# Patient Record
Sex: Female | Born: 1965 | Race: Black or African American | Hispanic: No | Marital: Married | Smoking: Never smoker
Health system: Southern US, Community
[De-identification: ages and names within clinical notes are randomized; demographics above are authoritative.]

## PROBLEM LIST (undated history)

## (undated) DIAGNOSIS — M545 Low back pain, unspecified: Secondary | ICD-10-CM

## (undated) DIAGNOSIS — M549 Dorsalgia, unspecified: Secondary | ICD-10-CM

## (undated) DIAGNOSIS — M25512 Pain in left shoulder: Secondary | ICD-10-CM

## (undated) DIAGNOSIS — C569 Malignant neoplasm of unspecified ovary: Secondary | ICD-10-CM

## (undated) DIAGNOSIS — C73 Malignant neoplasm of thyroid gland: Secondary | ICD-10-CM

## (undated) DIAGNOSIS — I10 Essential (primary) hypertension: Secondary | ICD-10-CM

## (undated) DIAGNOSIS — M25511 Pain in right shoulder: Secondary | ICD-10-CM

## (undated) DIAGNOSIS — E7800 Pure hypercholesterolemia, unspecified: Secondary | ICD-10-CM

## (undated) DIAGNOSIS — G8929 Other chronic pain: Secondary | ICD-10-CM

## (undated) HISTORY — DX: Other chronic pain: G89.29

## (undated) HISTORY — PX: GASTRIC BYPASS: SHX52

## (undated) HISTORY — DX: Low back pain, unspecified: M54.50

## (undated) HISTORY — PX: FEMUR SURGERY: SHX943

## (undated) HISTORY — PX: BILATERAL CARPAL TUNNEL RELEASE: SHX6508

## (undated) HISTORY — DX: Pain in left shoulder: M25.512

## (undated) HISTORY — DX: Pure hypercholesterolemia, unspecified: E78.00

## (undated) HISTORY — PX: CHOLECYSTECTOMY: SHX55

## (undated) HISTORY — DX: Pain in right shoulder: M25.511

## (undated) HISTORY — DX: Malignant neoplasm of unspecified ovary: C56.9

## (undated) HISTORY — PX: ABDOMINAL HYSTERECTOMY: SHX81

## (undated) HISTORY — PX: REPLACEMENT TOTAL KNEE: SUR1224

## (undated) HISTORY — DX: Low back pain: M54.5

## (undated) HISTORY — DX: Dorsalgia, unspecified: M54.9

## (undated) HISTORY — DX: Malignant neoplasm of thyroid gland: C73

---

## 2007-12-20 ENCOUNTER — Ambulatory Visit (HOSPITAL_BASED_OUTPATIENT_CLINIC_OR_DEPARTMENT_OTHER): Admission: RE | Admit: 2007-12-20 | Discharge: 2007-12-20 | Payer: Self-pay | Admitting: Internal Medicine

## 2009-12-21 ENCOUNTER — Ambulatory Visit (HOSPITAL_BASED_OUTPATIENT_CLINIC_OR_DEPARTMENT_OTHER): Admission: RE | Admit: 2009-12-21 | Discharge: 2009-12-21 | Payer: Self-pay | Admitting: Internal Medicine

## 2009-12-21 ENCOUNTER — Ambulatory Visit: Payer: Self-pay | Admitting: Diagnostic Radiology

## 2009-12-22 ENCOUNTER — Ambulatory Visit (HOSPITAL_BASED_OUTPATIENT_CLINIC_OR_DEPARTMENT_OTHER): Admission: RE | Admit: 2009-12-22 | Discharge: 2009-12-22 | Payer: Self-pay | Admitting: Internal Medicine

## 2009-12-22 ENCOUNTER — Ambulatory Visit: Payer: Self-pay | Admitting: Diagnostic Radiology

## 2010-02-18 ENCOUNTER — Ambulatory Visit
Admission: RE | Admit: 2010-02-18 | Discharge: 2010-02-18 | Payer: Self-pay | Source: Home / Self Care | Attending: Gynecologic Oncology | Admitting: Gynecologic Oncology

## 2011-01-12 ENCOUNTER — Emergency Department (HOSPITAL_COMMUNITY)
Admission: EM | Admit: 2011-01-12 | Discharge: 2011-01-12 | Disposition: A | Discharge status: Home / Self Care | Payer: No Typology Code available for payment source | Attending: Emergency Medicine | Admitting: Emergency Medicine

## 2011-01-12 ENCOUNTER — Encounter: Payer: Self-pay | Admitting: Emergency Medicine

## 2011-01-12 ENCOUNTER — Emergency Department (HOSPITAL_COMMUNITY): Payer: No Typology Code available for payment source

## 2011-01-12 DIAGNOSIS — M545 Low back pain, unspecified: Secondary | ICD-10-CM | POA: Insufficient documentation

## 2011-01-12 DIAGNOSIS — I10 Essential (primary) hypertension: Secondary | ICD-10-CM | POA: Insufficient documentation

## 2011-01-12 DIAGNOSIS — Z9884 Bariatric surgery status: Secondary | ICD-10-CM | POA: Insufficient documentation

## 2011-01-12 DIAGNOSIS — M25569 Pain in unspecified knee: Secondary | ICD-10-CM | POA: Insufficient documentation

## 2011-01-12 DIAGNOSIS — E119 Type 2 diabetes mellitus without complications: Secondary | ICD-10-CM | POA: Insufficient documentation

## 2011-01-12 DIAGNOSIS — M25559 Pain in unspecified hip: Secondary | ICD-10-CM | POA: Insufficient documentation

## 2011-01-12 DIAGNOSIS — Z90710 Acquired absence of both cervix and uterus: Secondary | ICD-10-CM | POA: Insufficient documentation

## 2011-01-12 DIAGNOSIS — R071 Chest pain on breathing: Secondary | ICD-10-CM | POA: Insufficient documentation

## 2011-01-12 DIAGNOSIS — M542 Cervicalgia: Secondary | ICD-10-CM | POA: Insufficient documentation

## 2011-01-12 DIAGNOSIS — R51 Headache: Secondary | ICD-10-CM | POA: Insufficient documentation

## 2011-01-12 HISTORY — DX: Essential (primary) hypertension: I10

## 2011-01-12 MED ORDER — KETOROLAC TROMETHAMINE 60 MG/2ML IM SOLN
60.0000 mg | Freq: Once | INTRAMUSCULAR | Status: AC
  Administered 2011-01-12: 60 mg via INTRAMUSCULAR
  Filled 2011-01-12: qty 2

## 2011-01-12 MED ORDER — TRAMADOL HCL 50 MG PO TABS: 50.0000 mg | ORAL_TABLET | Freq: Four times a day (QID) | ORAL | Status: AC | PRN

## 2011-01-12 MED ORDER — OXYCODONE-ACETAMINOPHEN 5-325 MG PO TABS
1.0000 | ORAL_TABLET | Freq: Once | ORAL | Status: AC
  Administered 2011-01-12: 1 via ORAL
  Filled 2011-01-12: qty 1

## 2011-01-12 NOTE — Progress Notes (Signed)
Orthopedic Tech Progress Note Patient Details:  Jacqueline Love 10/15/1965 [id number]  Other Ortho Devices Type of Ortho Device: Crutches;Knee Immobilizer Ortho Device Location: (L) LE Ortho Device Interventions: Application   Jennye Moccasin 01/12/2011, 7:13 PM

## 2011-01-12 NOTE — ED Notes (Signed)
ER doc at bedside. ER doc removed c collar and backboard. Pt rolling around on bed. Pt c/o left knee pain. Pt difficult to assess d/t pt rolling around on bed and crying.
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
## 2011-01-12 NOTE — Discharge Instructions (Signed)
Your x-rays do not show any signs of fractures or dislocations.  Use ibuprofen 600 mg every 6 hours for the next 48 hours.  Use tramadol for more severe pain.  Followup with your Dr. if your symptoms.  Last more than 3-4 days.  Return for worse or uncontrolled symptoms.

## 2011-01-12 NOTE — ED Notes (Signed)
Knee immobilizer and crutches completed by ortho tech.
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
## 2017-07-05 ENCOUNTER — Encounter: Payer: Self-pay | Admitting: *Deleted

## 2017-07-06 ENCOUNTER — Encounter: Payer: Self-pay | Admitting: Neurology

## 2017-07-06 ENCOUNTER — Ambulatory Visit: Payer: Medicare Other | Admitting: Neurology

## 2017-07-06 ENCOUNTER — Telehealth: Payer: Self-pay | Admitting: Neurology

## 2017-07-06 NOTE — Telephone Encounter (Signed)
This patient did not show for a new patient appointment today. 

## 2017-08-14 ENCOUNTER — Ambulatory Visit (INDEPENDENT_AMBULATORY_CARE_PROVIDER_SITE_OTHER): Payer: Medicare Other | Admitting: Neurology

## 2017-08-14 ENCOUNTER — Encounter

## 2017-08-14 ENCOUNTER — Encounter: Payer: Self-pay | Admitting: Neurology

## 2017-08-14 VITALS — BP 157/102 | HR 80 | Ht 63.0 in | Wt 246.0 lb

## 2017-08-14 DIAGNOSIS — R413 Other amnesia: Secondary | ICD-10-CM

## 2017-08-14 DIAGNOSIS — E538 Deficiency of other specified B group vitamins: Secondary | ICD-10-CM

## 2017-08-14 DIAGNOSIS — R202 Paresthesia of skin: Secondary | ICD-10-CM | POA: Diagnosis not present

## 2017-08-14 NOTE — Progress Notes (Signed)
Reason for visit: Leg numbness, memory disturbance  Referring physician: Dr. Sanjuana Jacqueline Love is a 52 y.o. female  History of present illness:  [name] is a 52 year old right-handed black female with a history of obesity and chronic low back pain.  The patient is on disability for the back issues, she is on chronic opioid therapy for her back pain.  She indicates that over the last year or so she has had intermittent numbness that occurs down the legs, right greater than left.  She has had 2 severe episodes of numbness while sitting on the toilet, she was not able to control her legs.  The patient may have some numbness and tingling sensations in the hands and in the feet that she may notice at nighttime.  The patient had a gastric bypass procedure 10 years ago, she has not been taking vitamin B12 supplementation regularly.  The patient is on Topamax, but this was started just a month ago she claims.  The patient also has noted some problems with memory, difficulty remembering names for things or people over the last 6 months.  The patient feels that the problem is very mild but she is concerned about it.  The patient does not snore at night, she claims that her energy level during the day is good.  She denies any issues with balance, she has not had any falls.  She denies issues controlling the bowels or the bladder.  The patient has been seen by her primary care doctor, CT scan of the head was ordered, the patient claims that this has not yet been done.  She had MRI of the brain done in November 2017 when she had an event of Bell's palsy.  The study was relatively unremarkable.  Past Medical History:  Diagnosis Date  . Chronic pain   . Diabetes mellitus   . Hypercholesteremia   . Hypertension   . Low back pain   . Ovarian cancer ([place])   . Shoulder pain, bilateral   . Thyroid cancer ([place])   . Upper back pain     Past Surgical History:  Procedure Laterality Date  .  ABDOMINAL HYSTERECTOMY    . BILATERAL CARPAL TUNNEL RELEASE    . CHOLECYSTECTOMY    . FEMUR SURGERY    . GASTRIC BYPASS    . REPLACEMENT TOTAL KNEE      Family History  Problem Relation Age of Onset  . Hypertension Mother   . COPD Mother   . Hypertension Father   . Hypertension Brother   . Heart attack Brother     Social history:  reports that she has never smoked. She has never used smokeless tobacco. She reports that she drinks alcohol. She reports that she does not use drugs.  Medications:  Prior to Admission medications   Medication Sig Start Date End Date Taking? Authorizing Provider  amLODipine (NORVASC) 5 MG tablet Take 5 mg by mouth daily.     Yes [provider]  ATENOLOL PO Take 1 tablet by mouth daily.   Yes [provider]  azelastine (ASTELIN) 0.1 % nasal spray Place 2 sprays into both nostrils daily. Use in each nostril as directed   Yes [provider]  baclofen (LIORESAL) 10 MG tablet Take 10 mg by mouth 3 (three) times daily.   Yes [provider]  calcitRIOL (ROCALTROL) 0.5 MCG capsule Take 0.5 mcg by mouth daily.   Yes [provider]  calcium-vitamin D (RA  HI CAL) 500-200 MG-UNIT tablet Take 1 tablet by mouth daily with breakfast.   Yes [provider]  DULoxetine (CYMBALTA) 60 MG capsule Take 60 mg by mouth daily.   Yes [provider]  gabapentin (NEURONTIN) 300 MG capsule Take 300 mg by mouth 3 (three) times daily.   Yes [provider]  ibuprofen (ADVIL,MOTRIN) 800 MG tablet Take 800 mg by mouth every 8 (eight) hours as needed.   Yes [provider]  levothyroxine (SYNTHROID, LEVOTHROID) 200 MCG tablet Take 200 mcg by mouth daily before breakfast.   Yes [provider]  LISINOPRIL PO Take 1 tablet by mouth daily.   Yes [provider]  LORazepam (ATIVAN) 1 MG tablet Take 1 mg by mouth daily.   Yes [provider]  metoCLOPramide (REGLAN) 10 MG tablet  Take 10 mg by mouth 3 (three) times daily.   Yes [provider]  metoprolol succinate (TOPROL-XL) 100 MG 24 hr tablet Take 100 mg by mouth daily. Take with or immediately following a meal.   Yes [provider]  Multiple Vitamins-Minerals (MULTIVITAMINS THER. W/MINERALS) TABS Take 1 tablet by mouth daily.     Yes [provider]  oxyCODONE ER (XTAMPZA ER) 13.5 MG C12A Take 1 tablet by mouth every 12 (twelve) hours.   Yes [provider]  pantoprazole (PROTONIX) 40 MG tablet Take 40 mg by mouth daily.   Yes [provider]  phentermine 37.5 MG capsule Take 37.5 mg by mouth every morning.   Yes [provider]  Polysaccharide Iron Complex (FERREX 150 PO) Take 1 capsule by mouth daily.   Yes [provider]  ranitidine (ZANTAC) 300 MG tablet Take 300 mg by mouth at bedtime.   Yes [provider]  topiramate (TOPAMAX) 100 MG tablet Take 100 mg by mouth at bedtime.   Yes [provider]      Allergies  Allergen Reactions  . Corticosteroids   . Latex Itching  . Prednisolone   . Tramadol Itching    ROS:  Out of a complete 14 system review of symptoms, the patient complains only of the following symptoms, and all other reviewed systems are negative.  Memory loss Numbness  Blood pressure (!) 157/102, pulse 80, height 5\' 3"  (1.6 m), weight 246 lb (111.6 kg).  Physical Exam  General: The patient is alert and cooperative at the time of the examination.  The patient is markedly obese.  Eyes: Pupils are equal, round, and reactive to light. Discs are flat bilaterally.  Neck: The neck is supple, no carotid bruits are noted.  Respiratory: The respiratory examination is clear.  Cardiovascular: The cardiovascular examination reveals a regular rate and rhythm, no obvious murmurs or rubs are noted.  Skin: Extremities are without significant edema.  Neurologic Exam  Mental status: The patient is alert and  oriented x 3 at the time of the examination. The patient has apparent normal recent and remote memory, with an apparently normal attention span and concentration ability.  Mini-Mental status examination done today shows a total score of 27/30.  Cranial nerves: Facial symmetry is present. There is good sensation of the face to pinprick and soft touch bilaterally. The strength of the facial muscles and the muscles to head turning and shoulder shrug are normal bilaterally. Speech is well enunciated, no aphasia or dysarthria is noted. Extraocular movements are full. Visual fields are full. The tongue is midline, and the patient has symmetric elevation of the soft palate. No obvious  hearing deficits are noted.  Motor: The motor testing reveals 5 over 5 strength of all 4 extremities. Good symmetric motor tone is noted throughout.  Sensory: Sensory testing is intact to pinprick, soft touch, vibration sensation, and position sense on all 4 extremities. No evidence of extinction is noted.  Coordination: Cerebellar testing reveals good finger-nose-finger and heel-to-shin bilaterally.  Gait and station: Gait is normal. Tandem gait is normal. Romberg is negative. No drift is seen.  Reflexes: Deep tendon reflexes are symmetric, but are depressed bilaterally in the legs. Toes are downgoing bilaterally.    MRI lumbar and thoracic 03/23/17:  IMPRESSION: MR THORACIC SPINE IMPRESSION  No acute osseous abnormality. Normal cord signal.  Mild discogenic degenerative changes of thoracic spine. No significant disc displacement, foraminal stenosis, or canal stenosis.  MR LUMBAR SPINE IMPRESSION  No acute osseous abnormality.  Small right L2-3 and L3-4 foraminal disc protrusions with mild right foraminal stenosis. Otherwise no significant foraminal or canal stenosis.    MRI brain 01/09/16:  IMPRESSION: 1.No acute intracranial abnormality.  2.Partial empty sella. This can be seen with idiopathic  intracranial hypertension.    Assessment/Plan:  1.  Reports of mild memory disturbance  2.  Paresthesias, all 4 extremities  The patient indicates that the paresthesias in the arms and legs predated the use of Topamax.  The patient is on a multitude of psychoactive medications including baclofen, oxycodone, Topamax, Ativan, gabapentin, and Reglan.  The patient will be set up for blood work today.  She will have nerve conduction studies done on both legs and one arm, EMG on the right leg.  She will follow-up in 6 months to follow the memory issue.  Jill Alexanders MD 08/14/2017 2:34 PM  [facility] [address] [place], [place] [address]  Phone [phone] Fax [phone]

## 2017-08-15 ENCOUNTER — Telehealth: Payer: Self-pay | Admitting: *Deleted

## 2017-08-15 LAB — VITAMIN B12: VITAMIN B 12: 1311 pg/mL — AB (ref 232–1245)

## 2017-08-15 LAB — RPR: RPR: NONREACTIVE

## 2017-08-15 LAB — SEDIMENTATION RATE: Sed Rate: 34 mm/hr (ref 0–40)

## 2017-08-15 LAB — HIV ANTIBODY (ROUTINE TESTING W REFLEX): HIV Screen 4th Generation wRfx: NONREACTIVE

## 2017-08-15 NOTE — Telephone Encounter (Signed)
-----   Message from Kathrynn Ducking, MD sent at 08/15/2017  7:17 AM EDT -----  The blood work results are unremarkable. Please call the patient.  ----- Message ----- From: Lavone Neri Lab Results In Sent: 08/15/2017   5:40 AM To: Kathrynn Ducking, MD

## 2017-08-15 NOTE — Telephone Encounter (Signed)
Called and spoke with patient about unremarkable labs per CW,MD note. She verbalized understanding.

## 2017-10-02 ENCOUNTER — Encounter: Payer: Medicare Other | Admitting: Neurology

## 2017-10-02 ENCOUNTER — Telehealth: Payer: Self-pay | Admitting: Neurology

## 2017-10-02 NOTE — Telephone Encounter (Signed)
This patient did not show for an EMG and nerve conduction study procedure today.

## 2017-10-03 ENCOUNTER — Encounter: Payer: Self-pay | Admitting: Neurology

## 2018-03-14 ENCOUNTER — Telehealth: Payer: Self-pay

## 2018-03-14 ENCOUNTER — Ambulatory Visit: Payer: Medicare Other | Admitting: Adult Health

## 2018-03-14 NOTE — Telephone Encounter (Signed)
Patient was a no call/no show for their appointment today.   

## 2020-07-29 ENCOUNTER — Encounter: Payer: Self-pay | Admitting: Neurology

## 2020-07-29 ENCOUNTER — Telehealth: Payer: Self-pay | Admitting: Neurology

## 2020-07-29 ENCOUNTER — Institutional Professional Consult (permissible substitution): Payer: Medicare Other | Admitting: Neurology

## 2020-07-29 NOTE — Telephone Encounter (Signed)
This patient did not show for a new patient appointment today.
